# Patient Record
Sex: Male | Born: 1970 | Race: Black or African American | Hispanic: No | Marital: Married | Smoking: Never smoker
Health system: Southern US, Community
[De-identification: ages and names within clinical notes are randomized; demographics above are authoritative.]

## PROBLEM LIST (undated history)

## (undated) DIAGNOSIS — I10 Essential (primary) hypertension: Secondary | ICD-10-CM

---

## 2016-07-13 ENCOUNTER — Emergency Department (HOSPITAL_COMMUNITY)
Admission: EM | Admit: 2016-07-13 | Discharge: 2016-07-13 | Disposition: A | Discharge status: Home / Self Care | Payer: Commercial Managed Care - PPO | Attending: Emergency Medicine | Admitting: Emergency Medicine

## 2016-07-13 ENCOUNTER — Emergency Department (HOSPITAL_COMMUNITY): Payer: Commercial Managed Care - PPO

## 2016-07-13 ENCOUNTER — Encounter (HOSPITAL_COMMUNITY): Payer: Self-pay

## 2016-07-13 DIAGNOSIS — E876 Hypokalemia: Secondary | ICD-10-CM | POA: Diagnosis not present

## 2016-07-13 DIAGNOSIS — R55 Syncope and collapse: Secondary | ICD-10-CM | POA: Diagnosis present

## 2016-07-13 DIAGNOSIS — E860 Dehydration: Secondary | ICD-10-CM

## 2016-07-13 DIAGNOSIS — Z79899 Other long term (current) drug therapy: Secondary | ICD-10-CM | POA: Diagnosis not present

## 2016-07-13 DIAGNOSIS — I10 Essential (primary) hypertension: Secondary | ICD-10-CM | POA: Diagnosis not present

## 2016-07-13 DIAGNOSIS — N289 Disorder of kidney and ureter, unspecified: Secondary | ICD-10-CM | POA: Diagnosis not present

## 2016-07-13 HISTORY — DX: Essential (primary) hypertension: I10

## 2016-07-13 LAB — CBC
HCT: 43.8 % (ref 39.0–52.0)
Hemoglobin: 14.3 g/dL (ref 13.0–17.0)
MCH: 30 pg (ref 26.0–34.0)
MCHC: 32.6 g/dL (ref 30.0–36.0)
MCV: 91.8 fL (ref 78.0–100.0)
PLATELETS: 214 10*3/uL (ref 150–400)
RBC: 4.77 MIL/uL (ref 4.22–5.81)
RDW: 12.5 % (ref 11.5–15.5)
WBC: 7.4 10*3/uL (ref 4.0–10.5)

## 2016-07-13 LAB — I-STAT TROPONIN, ED: Troponin i, poc: 0.01 ng/mL (ref 0.00–0.08)

## 2016-07-13 LAB — BASIC METABOLIC PANEL
Anion gap: 8 (ref 5–15)
BUN: 13 mg/dL (ref 6–20)
CHLORIDE: 102 mmol/L (ref 101–111)
CO2: 30 mmol/L (ref 22–32)
CREATININE: 1.58 mg/dL — AB (ref 0.61–1.24)
Calcium: 9.3 mg/dL (ref 8.9–10.3)
GFR calc non Af Amer: 52 mL/min — ABNORMAL LOW (ref >60)
GFR, EST AFRICAN AMERICAN: 60 mL/min — AB (ref >60)
GLUCOSE: 130 mg/dL — AB (ref 65–99)
Potassium: 2.8 mmol/L — ABNORMAL LOW (ref 3.5–5.1)
Sodium: 140 mmol/L (ref 135–145)

## 2016-07-13 MED ORDER — SODIUM CHLORIDE 0.9 % IV BOLUS (SEPSIS)
1000.0000 mL | Freq: Once | INTRAVENOUS | Status: AC
  Administered 2016-07-13: 1000 mL via INTRAVENOUS

## 2016-07-13 MED ORDER — POTASSIUM CHLORIDE CRYS ER 20 MEQ PO TBCR
40.0000 meq | EXTENDED_RELEASE_TABLET | Freq: Once | ORAL | Status: AC
  Administered 2016-07-13: 40 meq via ORAL
  Filled 2016-07-13: qty 2

## 2016-07-13 NOTE — Discharge Instructions (Signed)
Potassium was 2.8. Creatinine which is a measure of kidney function was 1.58.   Take your potassium medicine in the morning and in the evening for the next several days. Follow-up your primary care doctor this week. Increase fluids.

## 2016-07-13 NOTE — ED Notes (Signed)
Dr Adriana Simasook and this RN in room with pt to discuss d/c instructions.

## 2016-07-13 NOTE — ED Notes (Signed)
Pt returns from radiology, placed back on cardiac monitoring

## 2016-07-13 NOTE — ED Notes (Signed)
Pt is to take potassium pills in the morning and in the evening for the next week before having K and Creatinine levels recheck[place] by [place]. Pt alert and oriented x4. Airway intact. Pt states "I feel so much better now" Pt is to discuss new BP med regemine with PCP.

## 2016-07-13 NOTE — ED Provider Notes (Signed)
[facility]-EMERGENCY DEPT Provider Note   CSN: [id number] Arrival date & time: 07/13/16  1517     History   Chief Complaint Chief Complaint  Patient presents with  . Near Syncope    HPI Salim Forero is a 45 y.o. male.  Patient was at football game this afternoon when he felt faint.  He did not truly pass out. His blood pressure was systolic 136 lying and 102 sitting. Initially, he had trouble breathing while lying flat, but this improved when he sat up. Patient describes similar episode several weeks ago. He has recently been diagnosed with hypertension and is on several medications for same. No chest pain, fever, neuro deficits      Past Medical History:  Diagnosis Date  . Hypertension     There are no active problems to display for this patient.   History reviewed. No pertinent surgical history.     Home Medications    Prior to Admission medications   Medication Sig Start Date End Date Taking? Authorizing Provider  amLODipine (NORVASC) 10 MG tablet Take 10 mg by mouth daily.   Yes Historical Provider, MD  carvedilol (COREG) 12.5 MG tablet Take 12.5 mg by mouth daily.   Yes Historical Provider, MD  hydrochlorothiazide (HYDRODIURIL) 25 MG tablet Take 25 mg by mouth daily.   Yes Historical Provider, MD  lisinopril (PRINIVIL,ZESTRIL) 20 MG tablet Take 20 mg by mouth 2 (two) times daily.   Yes Historical Provider, MD  potassium chloride SA (K-DUR,KLOR-CON) 20 MEQ tablet Take 20 mEq by mouth daily.   Yes Historical Provider, MD  simvastatin (ZOCOR) 20 MG tablet Take 20 mg by mouth daily at 6 PM.   Yes Historical Provider, MD  sucralfate (CARAFATE) 1 g tablet Take 1 g by mouth at bedtime.   Yes Historical Provider, MD    Family History History reviewed. No pertinent family history.  Social History Social History  Substance Use Topics  . Smoking status: Never Smoker  . Smokeless tobacco: Not on file  . Alcohol use No     Allergies   Review of patient's allergies  indicates no known allergies.   Review of Systems Review of Systems  All other systems reviewed and are negative.    Physical Exam Updated Vital Signs BP (!) 123/110   Pulse 84   Temp 98 F (36.7 C) (Oral)   Resp 18   SpO2 98%   Physical Exam  Constitutional: He is oriented to person, place, and time. He appears well-developed and well-nourished.  HENT:  Head: Normocephalic and atraumatic.  Eyes: Conjunctivae are normal.  Neck: Neck supple.  Cardiovascular: Normal rate and regular rhythm.   Pulmonary/Chest: Effort normal and breath sounds normal.  Abdominal: Soft. Bowel sounds are normal.  Musculoskeletal: Normal range of motion.  Neurological: He is alert and oriented to person, place, and time.  Skin: Skin is warm and dry.  Psychiatric: He has a normal mood and affect. His behavior is normal.  Nursing note and vitals reviewed.    ED Treatments / Results  Labs (all labs ordered are listed, but only abnormal results are displayed) Labs Reviewed  BASIC METABOLIC PANEL - Abnormal; Notable for the following:       Result Value   Potassium 2.8 (*)    Glucose, Bld 130 (*)    Creatinine, Ser 1.58 (*)    GFR calc non Af Amer 52 (*)    GFR calc Af Amer 60 (*)    All other components within normal  limits  CBC  I-STAT TROPOININ, ED    EKG  EKG Interpretation  Date/Time:  Saturday July 13 2016 15:23:12 EDT Ventricular Rate:  78 PR Interval:    QRS Duration: 92 QT Interval:  383 QTC Calculation: 437 R Axis:   68 Text Interpretation:  Sinus rhythm Ventricular premature complex Anterior infarct, old Confirmed by Keelon Zurn  MD, Mikala Podoll ([id number]) on 07/13/2016 4:55:31 PM       Radiology Dg Chest 2 View  Result Date: 07/13/2016 CLINICAL DATA:  Shortness of breath, dizziness, lightheadedness, dehydrated, congestion, history hypertension, respiratory distress EXAM: CHEST  2 VIEW COMPARISON:  None FINDINGS: Normal heart size, mediastinal contours, and pulmonary  vascularity. Bronchitic changes without infiltrate, pleural effusion or pneumothorax. Bones demineralized. IMPRESSION: Bronchitic changes without infiltrate. Electronically Signed   By: Ulyses SouthwardMark  Boles M.D.   On: 07/13/2016 17:02    Procedures Procedures (including critical care time)  Medications Ordered in ED Medications  sodium chloride 0.9 % bolus 1,000 mL (0 mLs Intravenous Stopped 07/13/16 1838)  potassium chloride SA (K-DUR,KLOR-CON) CR tablet 40 mEq (40 mEq Oral Given 07/13/16 1838)     Initial Impression / Assessment and Plan / ED Course  I have reviewed the triage vital signs and the nursing notes.  Pertinent labs & imaging results that were available during my care of the patient were reviewed by me and considered in my medical decision making (see chart for details).  Clinical Course    Abnormal potassium and creatinine noted. Patient may be overmedicated for his hypertension. All these issues were discussed with the patient including his labs.  He will get primary care follow-up on Monday. Advised to double potassium until then  Final Clinical Impressions(s) / ED Diagnoses   Final diagnoses:  Dehydration  Hypokalemia  Renal insufficiency    New Prescriptions New Prescriptions   No medications on file     Donnetta HutchingBrian Sindee Stucker, MD 07/13/16 [id number]

## 2016-07-13 NOTE — ED Triage Notes (Signed)
Per EMS, pt was at a football game and had a near syncopal episode. Has hx of htn and is on new bp med. BP was low initially and orthostatic. 136 systolic lying and 102 systolic sitting up. Pt began having difficulty breathing while lying flat and receiving fluid. Breath sounds very diminished in all fields with exception to faint expiratory wheezing. Pt had similar syncopal episode 3-4 weeks ago and that is when pt was diagnosed with hypertension and pt had similar respiratory issues at that time. Pt did not eat breakfast this morning which is normal but did have some nachos and water at the game. Most recent vs BP 113/78, HR 70 NSR, RR 20, spo2 99% on Neb. RA sat was 83%.

## 2016-07-13 NOTE — ED Notes (Signed)
Patient transported to X-ray 

## 2016-07-13 NOTE — ED Notes (Signed)
Dr Cook in room  

## 2016-09-16 ENCOUNTER — Emergency Department
Admission: EM | Admit: 2016-09-16 | Discharge: 2016-09-16 | Disposition: A | Discharge status: Home / Self Care | Payer: Commercial Managed Care - PPO | Attending: Emergency Medicine | Admitting: Emergency Medicine

## 2016-09-16 ENCOUNTER — Emergency Department: Payer: Commercial Managed Care - PPO

## 2016-09-16 ENCOUNTER — Encounter: Payer: Self-pay | Admitting: Emergency Medicine

## 2016-09-16 DIAGNOSIS — R0602 Shortness of breath: Secondary | ICD-10-CM | POA: Insufficient documentation

## 2016-09-16 DIAGNOSIS — I10 Essential (primary) hypertension: Secondary | ICD-10-CM | POA: Insufficient documentation

## 2016-09-16 DIAGNOSIS — Z79899 Other long term (current) drug therapy: Secondary | ICD-10-CM | POA: Insufficient documentation

## 2016-09-16 LAB — CBC WITH DIFFERENTIAL/PLATELET
Basophils Absolute: 0 10*3/uL (ref 0–0.1)
Basophils Relative: 1 %
EOS ABS: 0.4 10*3/uL (ref 0–0.7)
Eosinophils Relative: 6 %
HEMATOCRIT: 43.6 % (ref 40.0–52.0)
HEMOGLOBIN: 15.1 g/dL (ref 13.0–18.0)
LYMPHS ABS: 2.8 10*3/uL (ref 1.0–3.6)
LYMPHS PCT: 38 %
MCH: 30.8 pg (ref 26.0–34.0)
MCHC: 34.5 g/dL (ref 32.0–36.0)
MCV: 89.2 fL (ref 80.0–100.0)
MONOS PCT: 7 %
Monocytes Absolute: 0.5 10*3/uL (ref 0.2–1.0)
NEUTROS ABS: 3.6 10*3/uL (ref 1.4–6.5)
NEUTROS PCT: 48 %
Platelets: 219 10*3/uL (ref 150–440)
RBC: 4.89 MIL/uL (ref 4.40–5.90)
RDW: 14.2 % (ref 11.5–14.5)
WBC: 7.4 10*3/uL (ref 3.8–10.6)

## 2016-09-16 LAB — BASIC METABOLIC PANEL
Anion gap: 7 (ref 5–15)
BUN: 12 mg/dL (ref 6–20)
CHLORIDE: 98 mmol/L — AB (ref 101–111)
CO2: 35 mmol/L — AB (ref 22–32)
CREATININE: 1.2 mg/dL (ref 0.61–1.24)
Calcium: 8.9 mg/dL (ref 8.9–10.3)
GFR calc Af Amer: >60 mL/min (ref >60)
GFR calc non Af Amer: >60 mL/min (ref >60)
GLUCOSE: 137 mg/dL — AB (ref 65–99)
POTASSIUM: 3 mmol/L — AB (ref 3.5–5.1)
SODIUM: 140 mmol/L (ref 135–145)

## 2016-09-16 LAB — TROPONIN I
TROPONIN I: 0.04 ng/mL — AB (ref <0.03)
Troponin I: 0.04 ng/mL (ref <0.03)

## 2016-09-16 MED ORDER — ALBUTEROL SULFATE HFA 108 (90 BASE) MCG/ACT IN AERS: 2.0000 | INHALATION_SPRAY | Freq: Four times a day (QID) | RESPIRATORY_TRACT | 2 refills | Status: AC | PRN

## 2016-09-16 MED ORDER — IPRATROPIUM-ALBUTEROL 0.5-2.5 (3) MG/3ML IN SOLN
3.0000 mL | Freq: Once | RESPIRATORY_TRACT | Status: AC
  Administered 2016-09-16: 3 mL via RESPIRATORY_TRACT
  Filled 2016-09-16: qty 3

## 2016-09-16 NOTE — ED Notes (Signed)
Patient transported to X-ray 

## 2016-09-16 NOTE — ED Notes (Signed)
MD aware of HTN at d/c

## 2016-09-16 NOTE — ED Notes (Signed)
Family at bedside. 

## 2016-09-16 NOTE — ED Triage Notes (Signed)
Pt to ED via EMS from restaurant, pt called out for SOB. Per EMS pt htn and wheezes. 204/140, per pt he went outside and took his meds. Pt had duoneb en route. Pt A&Ox4, denies CP.

## 2016-09-16 NOTE — ED Provider Notes (Signed)
[facility] Emergency Department Provider Note   ____________________________________________   I have reviewed the triage vital signs and the nursing notes.   HISTORY  Chief Complaint Shortness of Breath and Hypertension   History limited by: Not Limited   HPI Tyrone Garrison is a 45 y.o. male who presents to the emergency department today because of concerns for shortness of breath. She has a history of high blood pressure and did not take his blood pressure this morning. Patient states he simply forgot. He then went out to lunch. While in she started feeling short of breath. He states it came on somewhat gradually continued to get worse. This point he realized that he had not taken his blood pressure medications so took his blood pressure medications. When first responders arrived his blood pressure was in the systolic 240s.The patient was given a breathing treatment by EMS which she states helped. He denied any chest pain. Denies any fevers. Has had a recent cold and congestion type feelings.   Past Medical History:  Diagnosis Date  . Hypertension     There are no active problems to display for this patient.   History reviewed. No pertinent surgical history.  Prior to Admission medications   Medication Sig Start Date End Date Taking? Authorizing Provider  amLODipine (NORVASC) 10 MG tablet Take 10 mg by mouth daily.    Historical Provider, MD  carvedilol (COREG) 12.5 MG tablet Take 12.5 mg by mouth daily.    Historical Provider, MD  hydrochlorothiazide (HYDRODIURIL) 25 MG tablet Take 25 mg by mouth daily.    Historical Provider, MD  potassium chloride SA (K-DUR,KLOR-CON) 20 MEQ tablet Take 20 mEq by mouth daily.    Historical Provider, MD  simvastatin (ZOCOR) 20 MG tablet Take 20 mg by mouth daily at 6 PM.    Historical Provider, MD  sucralfate (CARAFATE) 1 g tablet Take 1 g by mouth at bedtime.    Historical Provider, MD    Allergies Patient has no  known allergies.  No family history on file.  Social History Social History  Substance Use Topics  . Smoking status: Never Smoker  . Smokeless tobacco: Never Used  . Alcohol use No    Review of Systems  Constitutional: Negative for fever. Cardiovascular: Negative for chest pain. Respiratory: Positive for shortness of breath. Gastrointestinal: Negative for abdominal pain, vomiting and diarrhea. Neurological: Negative for headaches, focal weakness or numbness.  10-point ROS otherwise negative.  ____________________________________________   PHYSICAL EXAM:  VITAL SIGNS: ED Triage Vitals  Enc Vitals Group     BP 09/16/16 1616 (!) 203/129     Pulse Rate 09/16/16 1616 (!) 109     Resp 09/16/16 1616 18     Temp --      Temp Source 09/16/16 1616 Oral     SpO2 09/16/16 1616 93 %     Weight --      Height --      Head Circumference --      Peak Flow --      Pain Score 09/16/16 1620 0   Constitutional: Alert and oriented. Well appearing and in no distress. Eyes: Conjunctivae are normal. Normal extraocular movements. ENT   Head: Normocephalic and atraumatic.   Nose: No congestion/rhinnorhea.   Mouth/Throat: Mucous membranes are moist.   Neck: No stridor. Hematological/Lymphatic/Immunilogical: No cervical lymphadenopathy. Cardiovascular: Normal rate, regular rhythm.  No murmurs, rubs, or gallops.  Respiratory: Normal respiratory effort without tachypnea nor retractions. Breath sounds are clear and  equal bilaterally. Somewhat diffuse bilateral wheezing. Gastrointestinal: Soft and nontender. No distention.  Genitourinary: Deferred Musculoskeletal: Normal range of motion in all extremities. No lower extremity edema. Neurologic:  Normal speech and language. No gross focal neurologic deficits are appreciated.  Skin:  Skin is warm, dry and intact. No rash noted. Psychiatric: Mood and affect are normal. Speech and behavior are normal. Patient exhibits appropriate  insight and judgment.  ____________________________________________    LABS (pertinent positives/negatives)  Labs Reviewed  BASIC METABOLIC PANEL - Abnormal; Notable for the following:       Result Value   Potassium 3.0 (*)    Chloride 98 (*)    CO2 35 (*)    Glucose, Bld 137 (*)    All other components within normal limits  TROPONIN I - Abnormal; Notable for the following:    Troponin I 0.04 (*)    All other components within normal limits  TROPONIN I - Abnormal; Notable for the following:    Troponin I 0.04 (*)    All other components within normal limits  CBC WITH DIFFERENTIAL/PLATELET     ____________________________________________   EKG  I, Phineas SemenGraydon Al Gagen, attending physician, personally viewed and interpreted this EKG  EKG Time: 1620 Rate: 108 Rhythm: sinus tachycardia Axis: right axis deviation Intervals: qtc 455 QRS: narrow, q waves V2, I ST changes: no st elevation Impression: abnormal ekg ____________________________________________    RADIOLOGY  CXR  IMPRESSION: No active cardiopulmonary disease.   ____________________________________________   PROCEDURES  Procedures  ____________________________________________   INITIAL IMPRESSION / ASSESSMENT AND PLAN / ED COURSE  Pertinent labs & imaging results that were available during my care of the patient were reviewed by me and considered in my medical decision making (see chart for details).  Patient presented to the emergency department today because of concerns for shortness of breath and hypertension. On exam patient does have some wheezing. We will give breathing treatment here. Will check blood work and chest x-ray.  Clinical Course    Chest x-ray without findings. Blood work had a mildly elevated troponin. This was repeated without any change. This point I doubt ACS given lack of chest pain and stable troponin. Patient did feel better after breathing treatments. Given that the  breathing treatments did help will plan on discharging patient with prescription for albuterol inhaler. ____________________________________________   FINAL CLINICAL IMPRESSION(S) / ED DIAGNOSES  Final diagnoses:  Shortness of breath     Note: This dictation was prepared with Dragon dictation. Any transcriptional errors that result from this process are unintentional    Phineas SemenGraydon Jream Broyles, MD 09/16/16 2304

## 2016-09-16 NOTE — Discharge Instructions (Signed)
Please seek medical attention for any high fevers, chest pain, shortness of breath, change in behavior, persistent vomiting, bloody stool or any other new or concerning symptoms.  

## 2018-10-29 IMAGING — CR DG CHEST 2V
2 series · 2 of 2 positions shown · non-contrast
Comparison: None.

CLINICAL DATA: Congestion for 5 days.  Short of breath today.

EXAM:
CHEST  2 VIEW

[chest pa]
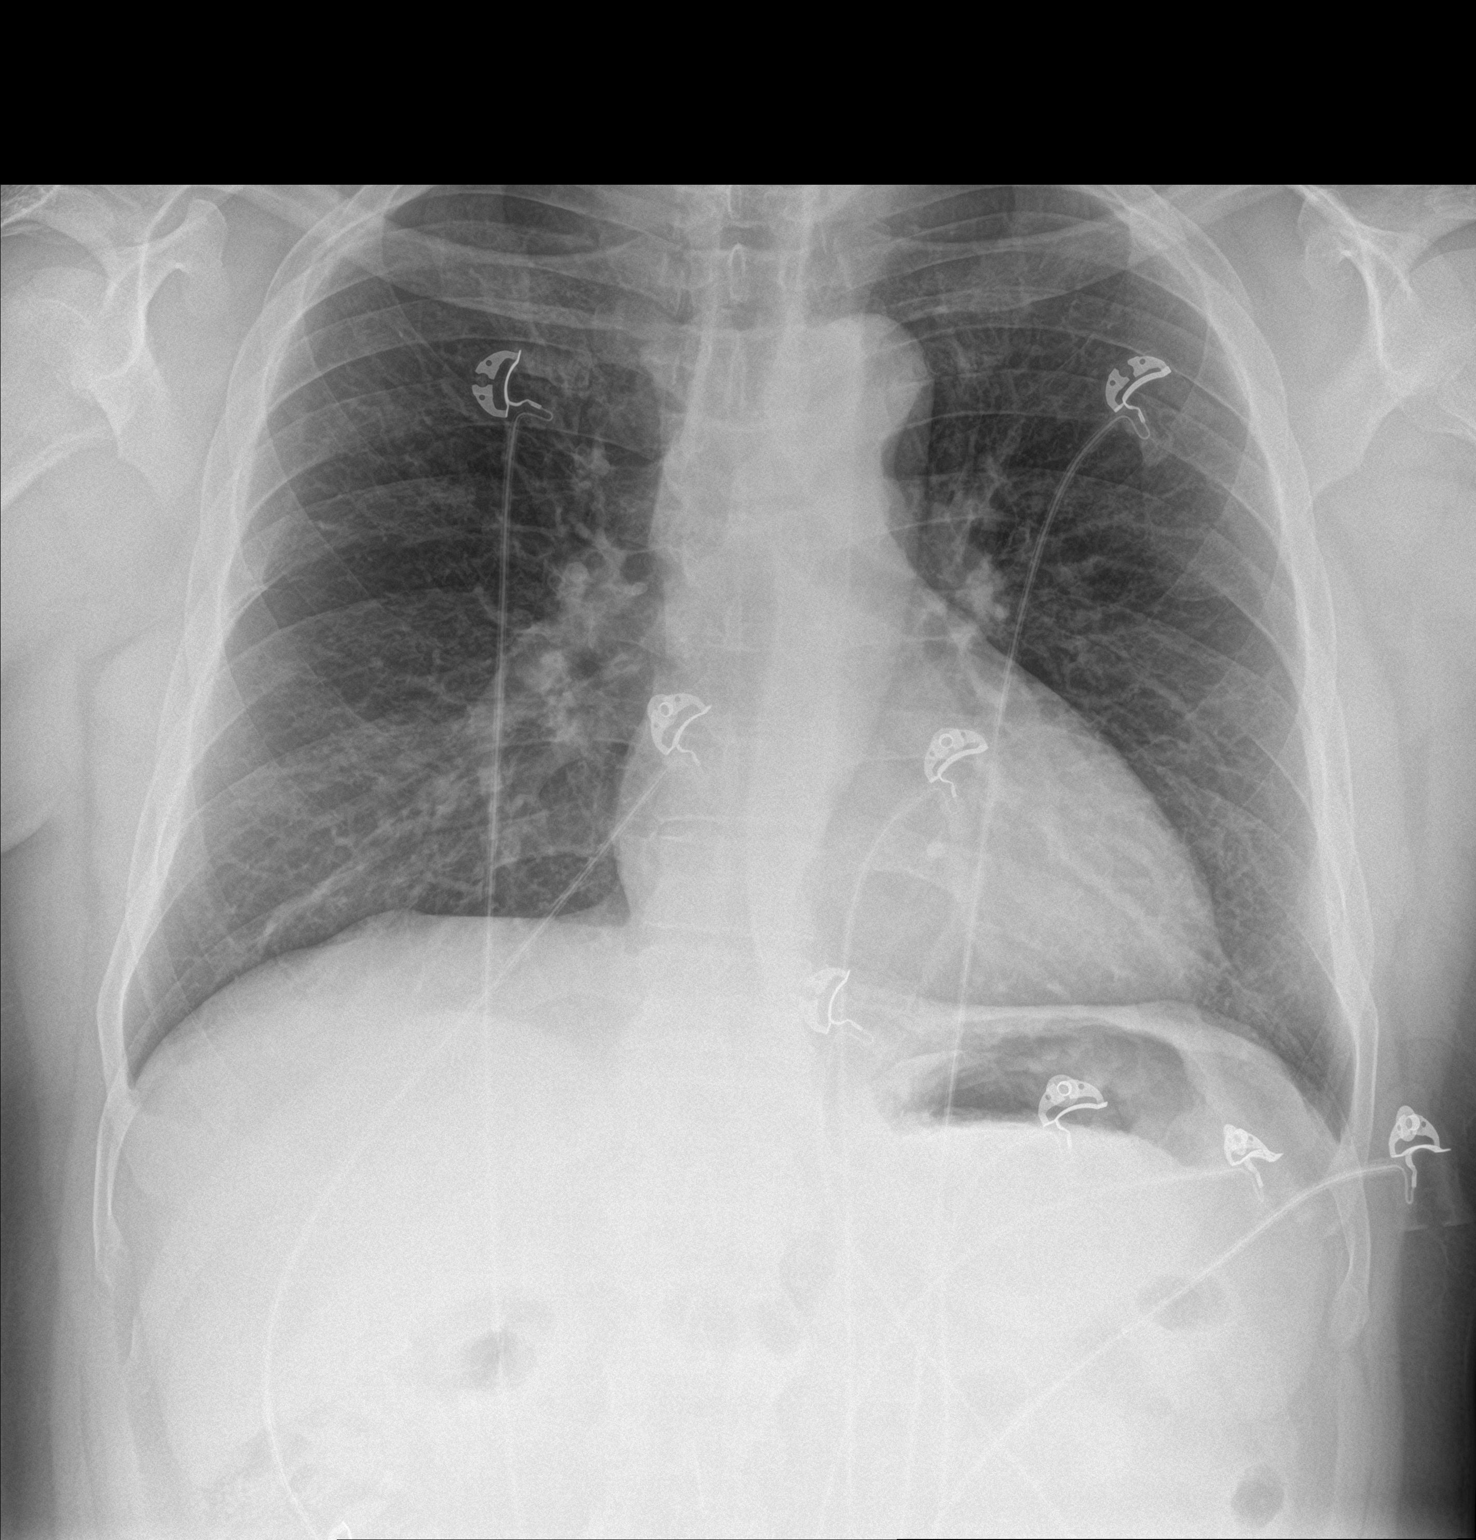

[chest lat]
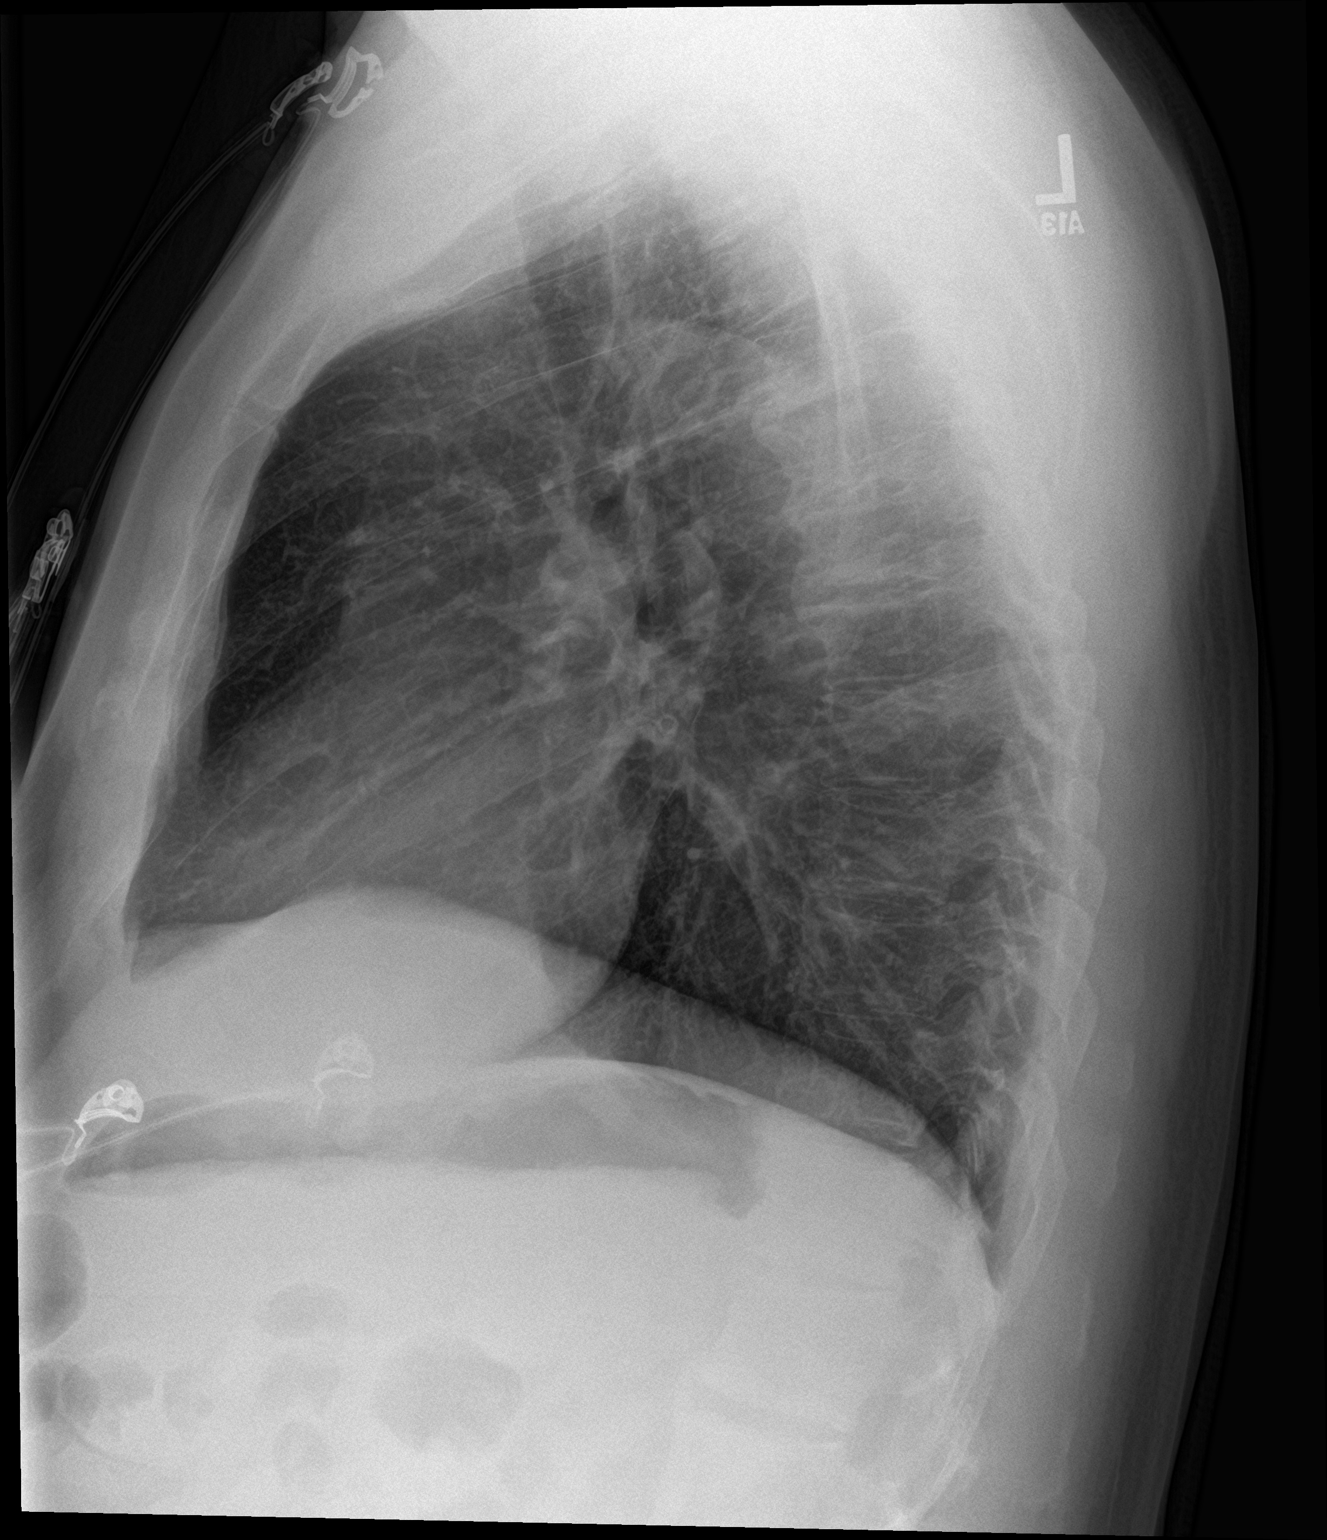

[2 of 2 positions shown; findings below may reference images not displayed]

FINDINGS: Normal heart size. Lungs clear. No pneumothorax. No pleural
effusion.
IMPRESSION: No active cardiopulmonary disease.
